# Patient Record
Sex: Male | Born: 1945 | Race: White | Hispanic: No | Marital: Single
Health system: Southern US, Community
[De-identification: ages and names within clinical notes are randomized; demographics above are authoritative.]

---

## 2019-03-14 ENCOUNTER — Emergency Department
Admission: EM | Admit: 2019-03-14 | Discharge: 2019-03-14 | Disposition: A | Discharge status: Home / Self Care | Payer: Self-pay | Attending: Emergency Medicine | Admitting: Emergency Medicine

## 2019-03-14 ENCOUNTER — Other Ambulatory Visit: Payer: Self-pay

## 2019-03-14 ENCOUNTER — Emergency Department: Payer: Self-pay

## 2019-03-14 DIAGNOSIS — R0602 Shortness of breath: Secondary | ICD-10-CM | POA: Insufficient documentation

## 2019-03-14 DIAGNOSIS — R55 Syncope and collapse: Secondary | ICD-10-CM | POA: Insufficient documentation

## 2019-03-14 LAB — CBC WITH DIFFERENTIAL/PLATELET
Abs Immature Granulocytes: 0.04 10*3/uL (ref 0.00–0.07)
Basophils Absolute: 0.1 10*3/uL (ref 0.0–0.1)
Basophils Relative: 1 %
Eosinophils Absolute: 0.1 10*3/uL (ref 0.0–0.5)
Eosinophils Relative: 1 %
HCT: 43.4 % (ref 39.0–52.0)
Hemoglobin: 14.3 g/dL (ref 13.0–17.0)
Immature Granulocytes: 0 %
Lymphocytes Relative: 20 %
Lymphs Abs: 1.8 10*3/uL (ref 0.7–4.0)
MCH: 31.8 pg (ref 26.0–34.0)
MCHC: 32.9 g/dL (ref 30.0–36.0)
MCV: 96.4 fL (ref 80.0–100.0)
Monocytes Absolute: 0.8 10*3/uL (ref 0.1–1.0)
Monocytes Relative: 9 %
Neutro Abs: 6.2 10*3/uL (ref 1.7–7.7)
Neutrophils Relative %: 69 %
Platelets: 330 10*3/uL (ref 150–400)
RBC: 4.5 MIL/uL (ref 4.22–5.81)
RDW: 14.2 % (ref 11.5–15.5)
WBC: 8.9 10*3/uL (ref 4.0–10.5)
nRBC: 0 % (ref 0.0–0.2)

## 2019-03-14 LAB — COMPREHENSIVE METABOLIC PANEL
ALT: 8 U/L (ref 0–44)
AST: 19 U/L (ref 15–41)
Albumin: 3.6 g/dL (ref 3.5–5.0)
Alkaline Phosphatase: 66 U/L (ref 38–126)
Anion gap: 11 (ref 5–15)
BUN: 14 mg/dL (ref 8–23)
CO2: 23 mmol/L (ref 22–32)
Calcium: 8.9 mg/dL (ref 8.9–10.3)
Chloride: 101 mmol/L (ref 98–111)
Creatinine, Ser: 1.25 mg/dL — ABNORMAL HIGH (ref 0.61–1.24)
GFR calc Af Amer: >60 mL/min (ref >60)
GFR calc non Af Amer: 57 mL/min — ABNORMAL LOW (ref >60)
Glucose, Bld: 135 mg/dL — ABNORMAL HIGH (ref 70–99)
Potassium: 4.2 mmol/L (ref 3.5–5.1)
Sodium: 135 mmol/L (ref 135–145)
Total Bilirubin: 0.4 mg/dL (ref 0.3–1.2)
Total Protein: 7.1 g/dL (ref 6.5–8.1)

## 2019-03-14 LAB — URINALYSIS, COMPLETE (UACMP) WITH MICROSCOPIC
Bilirubin Urine: NEGATIVE
Glucose, UA: NEGATIVE mg/dL
Hgb urine dipstick: NEGATIVE
Ketones, ur: 5 mg/dL — AB
Leukocytes,Ua: NEGATIVE
Nitrite: NEGATIVE
Protein, ur: NEGATIVE mg/dL
Specific Gravity, Urine: 1.011 (ref 1.005–1.030)
pH: 5 (ref 5.0–8.0)

## 2019-03-14 LAB — LACTIC ACID, PLASMA
Lactic Acid, Venous: 2.3 mmol/L (ref 0.5–1.9)
Lactic Acid, Venous: 1.6 mmol/L (ref 0.5–1.9)

## 2019-03-14 LAB — GLUCOSE, CAPILLARY: Glucose-Capillary: 132 mg/dL — ABNORMAL HIGH (ref 70–99)

## 2019-03-14 LAB — CK: Total CK: 30 U/L — ABNORMAL LOW (ref 49–397)

## 2019-03-14 LAB — TROPONIN I (HIGH SENSITIVITY)
Troponin I (High Sensitivity): 4 ng/L (ref <18)
Troponin I (High Sensitivity): 6 ng/L (ref <18)

## 2019-03-14 MED ORDER — SODIUM CHLORIDE 0.9 % IV BOLUS
1000.0000 mL | Freq: Once | INTRAVENOUS | Status: AC
  Administered 2019-03-14: 16:00:00 1000 mL via INTRAVENOUS

## 2019-03-14 NOTE — ED Notes (Signed)
Pt requesting to go to the bathroom. Pt ambulated to toilet, assisted by this RN and NT d/t pt being hypotensive upon arrival. Pt refused to use urinal.  NAD noted. BP stable

## 2019-03-14 NOTE — ED Notes (Signed)
Pt ambulated back to bed, call bell within reach, stretcher locked in lowest position.

## 2019-03-14 NOTE — Discharge Instructions (Addendum)
Return to the ER for new, worsening, or persistent severe weakness, dizziness, chest pain, shortness of breath, fever, or any other new or worsening symptoms that concern you.

## 2019-03-14 NOTE — ED Provider Notes (Signed)
[facility] Emergency Department Provider Note ____________________________________________   First MD Initiated Contact with Patient 03/14/19 1508     (approximate)  I have reviewed the triage vital signs and the nursing notes.   HISTORY  Chief Complaint Hypotension    HPI Michael Petty is a 74 y.o. male with no known past medical history who presents with near syncope.  Per EMS, the patient was found on the side of the road after he had been riding his bike to the store.  The patient states that he felt very weak and thought he was going to pass out.  He got off the bike and sat down, but states that he did not fully lose consciousness.  He states he has had increased weakness and some shortness of breath especially on exertion over the last several weeks.  He denies any known medical history and does not take any medications regularly.  He states he has had some intermittent shaking of his right leg.  History reviewed. No pertinent past medical history.  There are no problems to display for this patient.     Prior to Admission medications   Not on File    Allergies Patient has no allergy information on record.  No family history on file.  Social History Social History   Tobacco Use  . Smoking status: Not on file  Substance Use Topics  . Alcohol use: Not on file  . Drug use: Not on file    Review of Systems  Constitutional: No fever.  Positive generalized weakness. Eyes: No visual changes. ENT: No sore throat. Cardiovascular: Denies chest pain. Respiratory: Positive for shortness of breath. Gastrointestinal: No vomiting or diarrhea.  Genitourinary: Negative for dysuria or frequency.  Musculoskeletal: Negative for back pain. Skin: Negative for rash. Neurological: Negative for headache.   ____________________________________________   PHYSICAL EXAM:  VITAL SIGNS: ED Triage Vitals  Enc Vitals Group     BP --      Pulse Rate  03/14/19 1513 61     Resp 03/14/19 1513 20     Temp 03/14/19 1509 (!) 97.3 F (36.3 C)     Temp Source 03/14/19 1509 Oral     SpO2 03/14/19 1509 98 %     Weight 03/14/19 1510 135 lb (61.2 kg)     Height 03/14/19 1510 5\' 11"  (1.803 m)     Head Circumference --      Peak Flow --      Pain Score 03/14/19 1510 0     Pain Loc --      Pain Edu? --      Excl. in GC? --     Constitutional: Alert and oriented.  Somewhat weak and frail appearing but in no acute distress. Eyes: Conjunctivae are normal.  EOMI.  PERRLA. Head: Atraumatic. Nose: No congestion/rhinnorhea. Mouth/Throat: Mucous membranes are dry.   Neck: Normal range of motion.  Cardiovascular: Normal rate, regular rhythm. Grossly normal heart sounds.  Good peripheral circulation. Respiratory: Normal respiratory effort.  No retractions. Lungs CTAB. Gastrointestinal: Soft and nontender. No distention.  Genitourinary: No flank tenderness. Musculoskeletal: No lower extremity edema.  Extremities warm and well perfused.  Neurologic:  Normal speech and language.  Motor and sensory intact in all extremities.  Normal coordination.  Tremor to the right leg which resolves when patient lifted. Skin:  Skin is warm and dry. No rash noted. Psychiatric: Mood and affect are normal. Speech and behavior are normal.  ____________________________________________   LABS (all  labs ordered are listed, but only abnormal results are displayed)  Labs Reviewed  GLUCOSE, CAPILLARY - Abnormal; Notable for the following components:      Result Value   Glucose-Capillary 132 (*)    All other components within normal limits  COMPREHENSIVE METABOLIC PANEL - Abnormal; Notable for the following components:   Glucose, Bld 135 (*)    Creatinine, Ser 1.25 (*)    GFR calc non Af Amer 57 (*)    All other components within normal limits  LACTIC ACID, PLASMA - Abnormal; Notable for the following components:   Lactic Acid, Venous 2.3 (*)    All other components  within normal limits  URINALYSIS, COMPLETE (UACMP) WITH MICROSCOPIC - Abnormal; Notable for the following components:   Color, Urine YELLOW (*)    APPearance CLEAR (*)    Ketones, ur 5 (*)    Bacteria, UA RARE (*)    All other components within normal limits  CK - Abnormal; Notable for the following components:   Total CK 30 (*)    All other components within normal limits  LACTIC ACID, PLASMA  CBC WITH DIFFERENTIAL/PLATELET  TROPONIN I (HIGH SENSITIVITY)  TROPONIN I (HIGH SENSITIVITY)   ____________________________________________  EKG  ED ECG REPORT I, Dionne Bucy, the attending physician, personally viewed and interpreted this ECG.  Date: 03/14/2019 EKG Time: 1517 Rate: 87 Rhythm: normal sinus rhythm QRS Axis: normal Intervals: normal ST/T Wave abnormalities: Nonspecific ST abnormalities, limited by poor quality baseline Narrative Interpretation: Nonspecific abnormalities with no evidence of acute ischemia  ____________________________________________  RADIOLOGY  CXR: No focal infiltrate or other acute abnormality  ____________________________________________   PROCEDURES  Procedure(s) performed: No  Procedures  Critical Care performed: No ____________________________________________   INITIAL IMPRESSION / ASSESSMENT AND PLAN / ED COURSE  Pertinent labs & imaging results that were available during my care of the patient were reviewed by me and considered in my medical decision making (see chart for details).  74 year old male with no known PMH (although the patient states that he has not been to a doctor in a long time) presents with generalized weakness and near syncope after he was found at the side of the road.  He became weak while riding his bike to the store.  He states that he did not fully lose consciousness.  He reports generalized weakness, some shortness of breath, and chronic tremor or shaking of the right leg which he states is  intermittent.  On exam, the patient is weak and frail appearing but in no acute distress.  His vital signs are normal.  Neurologic exam is nonfocal.  He does have dry mucous membranes.  The physical exam is otherwise unremarkable for acute findings.  Differential is broad but includes dehydration, renal insufficiency, other metabolic etiology, infection/sepsis, or less likely cardiac etiology.  We will obtain chest x-ray, lab work-up, give fluids, and reassess.   ----------------------------------------- 6:24 PM on 03/14/2019 -----------------------------------------  Initial lab work-up is unremarkable except the patient's first lactic acid was minimally elevated, although not really consistent with sepsis.  The remainder of the labs are reassuring.  Ischial and repeat troponin are both negative.  Patient's vital signs remained stable throughout his ED stay.  He states he feels much better after fluids.  At this time, he is ambulating without difficulty.  Overall, I suspect a vasovagal episode or other benign etiology.  The patient states he wants to go home.  At this time, he is stable for discharge.  I counseled him on  the results of the work-up.  I gave him thorough return precautions and he expressed understanding.  ____________________________________________   FINAL CLINICAL IMPRESSION(S) / ED DIAGNOSES  Final diagnoses:  Near syncope      NEW MEDICATIONS STARTED DURING THIS VISIT:  New Prescriptions   No medications on file     Note:  This document was prepared using Dragon voice recognition software and may include unintentional dictation errors.    Arta Silence, MD 03/14/19 1825

## 2019-03-14 NOTE — ED Notes (Signed)
This RN walked into room to check on pt, pt found using the toilet without assistance, did not use call bell, call bell within reach. This RN attempted stand by assist, pt refused, stating, "I just need to be left alone". Call bell within reach

## 2019-03-14 NOTE — ED Notes (Signed)
Dr. Marisa Severin notified of lactic 2.3. No new orders at this time, fluid bolus infusing

## 2019-03-14 NOTE — ED Notes (Signed)
Pt repeatedly coming out to hallway requesting IV to be taken out and to leave.  Siadecki aware.  IV removed, discharge instructions reviewed, no questions or concerns at this time. PT walking with steady gait, NAD noted

## 2019-03-14 NOTE — ED Triage Notes (Signed)
Pt to ED via [facility], was found on the side of the road. EMS reports pt was hypotensive, 90's systolic, placed in trendelenburg, systolic increased to 100's.  EMS reports CBG 166.  Alert and oriented x4.  Pt right leg shaking, EMS reports pt has been shaking, denies being cold.

## 2020-10-28 IMAGING — DX DG CHEST 1V PORT
1 series · 1 of 1 positions shown · non-contrast
Comparison: None.

CLINICAL DATA: Shortness of breath

EXAM:
PORTABLE CHEST 1 VIEW

[chest ap]
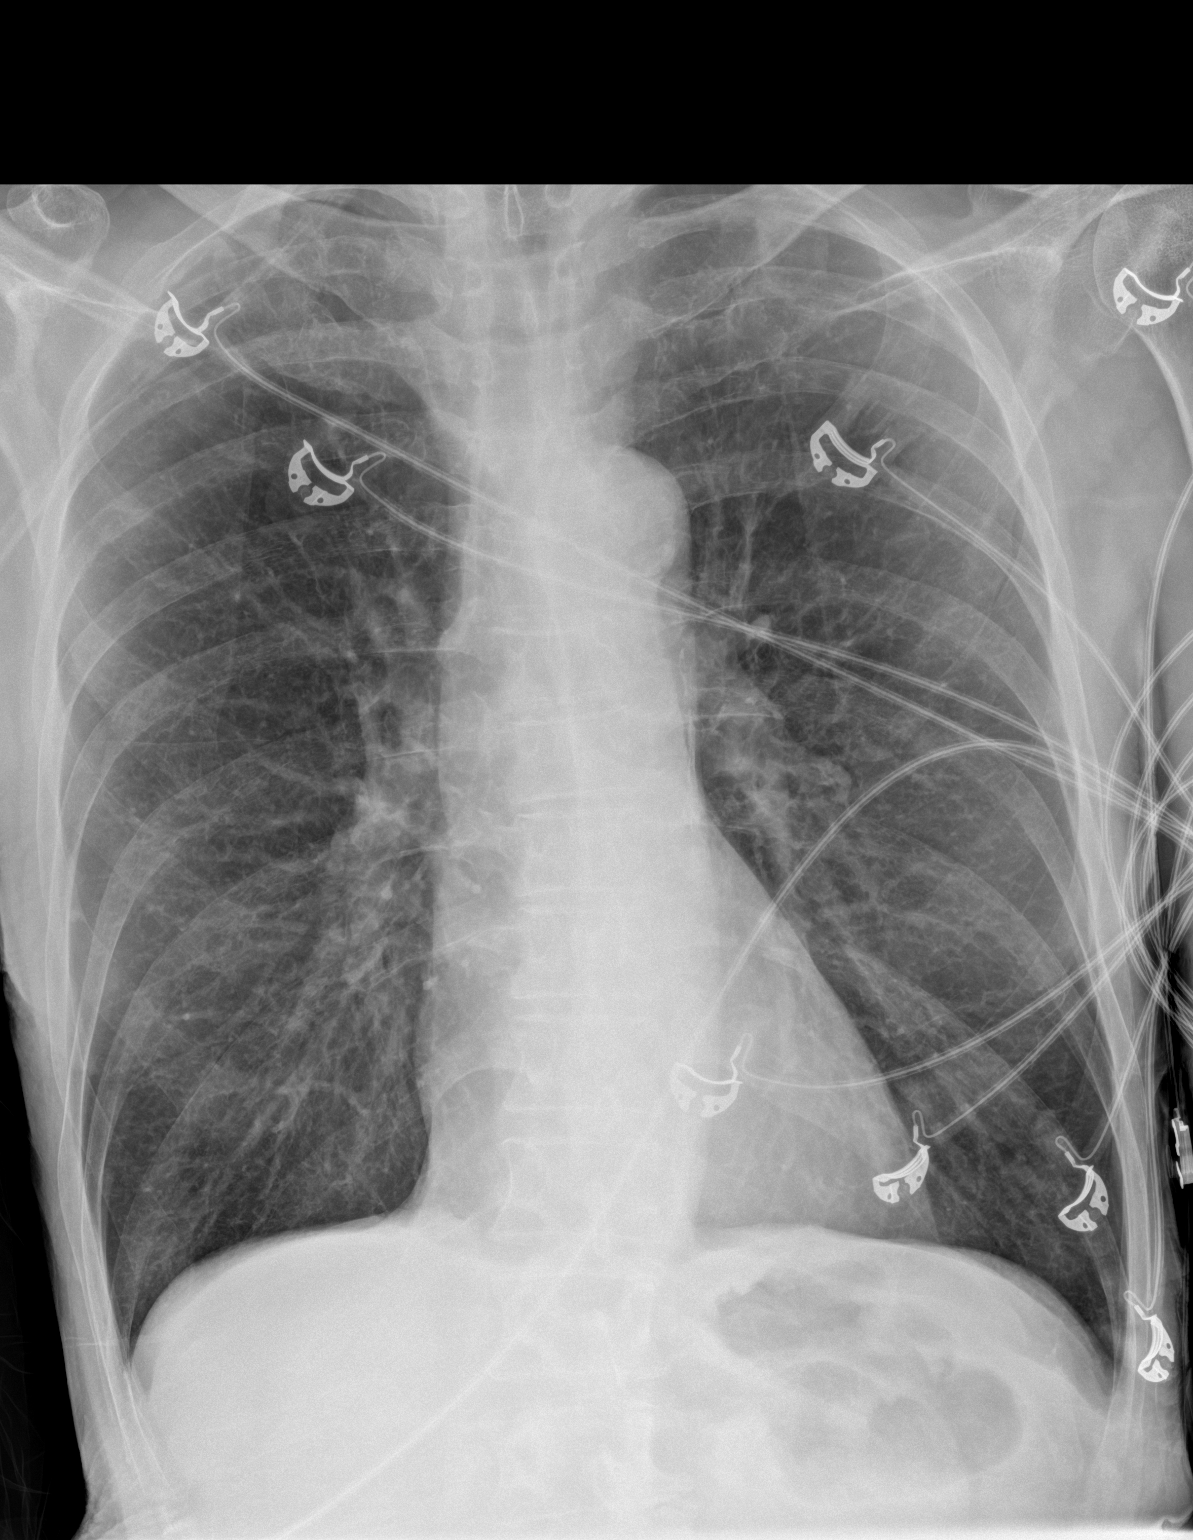

[1 of 1 positions shown; findings below may reference images not displayed]

FINDINGS: Cardiac shadow is within normal limits. The lungs are well aerated
bilaterally. Biapical scarring is seen. Mild hyperinflation is noted
consistent with COPD. No acute bony abnormality is noted.
IMPRESSION: COPD without acute abnormality.

## 2022-12-29 DEATH — deceased
